# Patient Record
Sex: Male | Born: 1965 | Race: Black or African American | Hispanic: No | Marital: Married | Smoking: Never smoker
Health system: Southern US, Community
[De-identification: ages and names within clinical notes are randomized; demographics above are authoritative.]

## PROBLEM LIST (undated history)

## (undated) DIAGNOSIS — Y249XXA Unspecified firearm discharge, undetermined intent, initial encounter: Secondary | ICD-10-CM

## (undated) DIAGNOSIS — I10 Essential (primary) hypertension: Secondary | ICD-10-CM

## (undated) DIAGNOSIS — R7303 Prediabetes: Secondary | ICD-10-CM

## (undated) DIAGNOSIS — K219 Gastro-esophageal reflux disease without esophagitis: Secondary | ICD-10-CM

## (undated) DIAGNOSIS — G8929 Other chronic pain: Secondary | ICD-10-CM

## (undated) DIAGNOSIS — N189 Chronic kidney disease, unspecified: Secondary | ICD-10-CM

## (undated) DIAGNOSIS — W3400XA Accidental discharge from unspecified firearms or gun, initial encounter: Secondary | ICD-10-CM

## (undated) DIAGNOSIS — M549 Dorsalgia, unspecified: Secondary | ICD-10-CM

## (undated) HISTORY — DX: Other chronic pain: G89.29

## (undated) HISTORY — DX: Chronic kidney disease, unspecified: N18.9

## (undated) HISTORY — DX: Accidental discharge from unspecified firearms or gun, initial encounter: W34.00XA

## (undated) HISTORY — DX: Prediabetes: R73.03

## (undated) HISTORY — DX: Unspecified firearm discharge, undetermined intent, initial encounter: Y24.9XXA

## (undated) HISTORY — DX: Gastro-esophageal reflux disease without esophagitis: K21.9

## (undated) HISTORY — PX: BACK SURGERY: SHX140

## (undated) HISTORY — PX: LEG SURGERY: SHX1003

## (undated) HISTORY — DX: Dorsalgia, unspecified: M54.9

---

## 1999-05-30 ENCOUNTER — Encounter: Payer: Self-pay | Admitting: Neurosurgery

## 1999-05-31 ENCOUNTER — Encounter: Payer: Self-pay | Admitting: Neurosurgery

## 1999-05-31 ENCOUNTER — Ambulatory Visit (HOSPITAL_COMMUNITY): Admission: RE | Admit: 1999-05-31 | Discharge: 1999-06-01 | Payer: Self-pay | Admitting: Neurosurgery

## 2007-11-10 ENCOUNTER — Emergency Department (HOSPITAL_COMMUNITY): Admission: EM | Admit: 2007-11-10 | Discharge: 2007-11-10 | Payer: Self-pay | Admitting: Emergency Medicine

## 2011-08-14 ENCOUNTER — Emergency Department (HOSPITAL_COMMUNITY)
Admission: EM | Admit: 2011-08-14 | Discharge: 2011-08-14 | Disposition: A | Discharge status: Home / Self Care | Payer: No Typology Code available for payment source | Attending: Emergency Medicine | Admitting: Emergency Medicine

## 2011-08-14 ENCOUNTER — Emergency Department (HOSPITAL_COMMUNITY): Payer: No Typology Code available for payment source

## 2011-08-14 ENCOUNTER — Encounter (HOSPITAL_COMMUNITY): Payer: Self-pay | Admitting: *Deleted

## 2011-08-14 DIAGNOSIS — I10 Essential (primary) hypertension: Secondary | ICD-10-CM | POA: Insufficient documentation

## 2011-08-14 DIAGNOSIS — M549 Dorsalgia, unspecified: Secondary | ICD-10-CM

## 2011-08-14 DIAGNOSIS — M545 Low back pain, unspecified: Secondary | ICD-10-CM | POA: Insufficient documentation

## 2011-08-14 DIAGNOSIS — Z79899 Other long term (current) drug therapy: Secondary | ICD-10-CM | POA: Insufficient documentation

## 2011-08-14 HISTORY — DX: Essential (primary) hypertension: I10

## 2011-08-14 MED ORDER — METHOCARBAMOL 500 MG PO TABS
1000.0000 mg | ORAL_TABLET | Freq: Once | ORAL | Status: AC
  Administered 2011-08-14: 1000 mg via ORAL
  Filled 2011-08-14: qty 2

## 2011-08-14 MED ORDER — OXYCODONE-ACETAMINOPHEN 5-325 MG PO TABS: 1.0000 | ORAL_TABLET | ORAL | Status: AC | PRN

## 2011-08-14 MED ORDER — CYCLOBENZAPRINE HCL 10 MG PO TABS: 10.0000 mg | ORAL_TABLET | Freq: Three times a day (TID) | ORAL | Status: AC | PRN

## 2011-08-14 MED ORDER — IBUPROFEN 800 MG PO TABS
800.0000 mg | ORAL_TABLET | Freq: Once | ORAL | Status: AC
  Administered 2011-08-14: 800 mg via ORAL
  Filled 2011-08-14: qty 1
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
## 2011-08-20 NOTE — ED Provider Notes (Signed)
Medical screening examination/treatment/procedure(s) were performed by non-physician practitioner and as supervising physician I was immediately available for consultation/collaboration.  Nicoletta Dress. Colon Branch, MD 08/20/11 1020
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
## 2012-06-05 ENCOUNTER — Encounter: Payer: Self-pay | Admitting: Urgent Care

## 2012-06-05 ENCOUNTER — Ambulatory Visit (INDEPENDENT_AMBULATORY_CARE_PROVIDER_SITE_OTHER): Payer: Medicare Other | Admitting: Urgent Care

## 2012-06-05 ENCOUNTER — Other Ambulatory Visit: Payer: Self-pay | Admitting: Internal Medicine

## 2012-06-05 VITALS — BP 126/79 | HR 86 | Temp 97.4°F | Ht 71.0 in | Wt 234.4 lb

## 2012-06-05 DIAGNOSIS — K921 Melena: Secondary | ICD-10-CM

## 2012-06-05 MED ORDER — PEG 3350-KCL-NA BICARB-NACL 420 G PO SOLR: 4000.0000 mL | ORAL | Status: DC

## 2012-06-05 NOTE — Assessment & Plan Note (Signed)
Maxwell Howe is a pleasant 46 y.o. male with several episodes of recent hematochezia.  No response to hemorrhoid treatment.  Colonoscopy for further evaluation with Dr. Jena Gauss to determine source of bleeding.   Differentials include colorectal ca, polyp, or benign anorectal source.  I have discussed risks & benefits which include, but are not limited to, bleeding, infection, perforation & drug reaction.  The patient agrees with this plan & written consent will be obtained.

## 2012-06-05 NOTE — Progress Notes (Signed)
Faxed to PCP
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
## 2012-06-05 NOTE — Patient Instructions (Addendum)
Colonoscopy with Dr Jena Gauss Call if severe bleeding or go immediately to ER  Rectal Bleeding Rectal bleeding is when blood passes out of the anus. It is usually a sign that something is wrong. It may not be serious, but it should always be evaluated. Rectal bleeding may present as bright red blood or extremely dark stools. The color may range from dark red or maroon to black (like tar). It is important that the cause of rectal bleeding be identified so treatment can be started and the problem corrected. CAUSES   Hemorrhoids. These are enlarged (dilated) blood vessels or veins in the anal or rectal area.  Fistulas. Theseare abnormal, burrowing channels that usually run from inside the rectum to the skin around the anus. They can bleed.  Anal fissures. This is a tear in the tissue of the anus. Bleeding occurs with bowel movements.  Diverticulosis. This is a condition in which pockets or sacs project from the bowel wall. Occasionally, the sacs can bleed.  Diverticulitis. Thisis an infection involving diverticulosis of the colon.  Proctitis and colitis. These are conditions in which the rectum, colon, or both, can become inflamed and pitted (ulcerated).  Polyps and cancer. Polyps are non-cancerous (benign) growths in the colon that may bleed. Certain types of polyps turn into cancer.  Protrusion of the rectum. Part of the rectum can project from the anus and bleed.  Certain medicines.  Intestinal infections.  Blood vessel abnormalities. HOME CARE INSTRUCTIONS  Eat a high-fiber diet to keep your stool soft.  Limit activity.  Drink enough fluids to keep your urine clear or pale yellow.  Warm baths may be useful to soothe rectal pain.  Follow up with your caregiver as directed. SEEK IMMEDIATE MEDICAL CARE IF:  You develop increased bleeding.  You have black or dark red stools.  You vomit blood or material that looks like coffee grounds.  You have abdominal pain or  tenderness.  You have a fever.  You feel weak, nauseous, or you faint.  You have severe rectal pain or you are unable to have a bowel movement. MAKE SURE YOU:  Understand these instructions.  Will watch your condition.  Will get help right away if you are not doing well or get worse. Document Released: 01/05/2002 Document Revised: 10/08/2011 Document Reviewed: 12/31/2010 [facility] Patient Information 2013 [place], [place].

## 2012-06-17 ENCOUNTER — Encounter (HOSPITAL_COMMUNITY): Payer: Self-pay | Admitting: Pharmacy Technician

## 2012-06-24 ENCOUNTER — Encounter (HOSPITAL_COMMUNITY): Payer: Self-pay | Admitting: *Deleted

## 2012-06-24 ENCOUNTER — Ambulatory Visit (HOSPITAL_COMMUNITY)
Admission: RE | Admit: 2012-06-24 | Discharge: 2012-06-24 | Disposition: A | Discharge status: Home / Self Care | Payer: Medicare Other | Source: Ambulatory Visit | Attending: Internal Medicine | Admitting: Internal Medicine

## 2012-06-24 ENCOUNTER — Encounter (HOSPITAL_COMMUNITY)
Admission: RE | Disposition: A | Discharge status: Home / Self Care | Payer: Self-pay | Source: Ambulatory Visit | Attending: Internal Medicine

## 2012-06-24 DIAGNOSIS — I10 Essential (primary) hypertension: Secondary | ICD-10-CM | POA: Insufficient documentation

## 2012-06-24 DIAGNOSIS — K921 Melena: Secondary | ICD-10-CM

## 2012-06-24 HISTORY — PX: COLONOSCOPY: SHX5424

## 2012-06-24 SURGERY — COLONOSCOPY
Anesthesia: Moderate Sedation

## 2012-06-24 MED ORDER — MEPERIDINE HCL 100 MG/ML IJ SOLN
INTRAMUSCULAR | Status: AC
  Filled 2012-06-24: qty 2

## 2012-06-24 MED ORDER — MIDAZOLAM HCL 5 MG/5ML IJ SOLN
INTRAMUSCULAR | Status: DC | PRN
  Administered 2012-06-24 (×2): 2 mg via INTRAVENOUS

## 2012-06-24 MED ORDER — SODIUM CHLORIDE 0.45 % IV SOLN
INTRAVENOUS | Status: DC
  Administered 2012-06-24: 12:00:00 via INTRAVENOUS

## 2012-06-24 MED ORDER — STERILE WATER FOR IRRIGATION IR SOLN
Status: DC | PRN
  Administered 2012-06-24: 12:00:00

## 2012-06-24 MED ORDER — MIDAZOLAM HCL 5 MG/5ML IJ SOLN
INTRAMUSCULAR | Status: AC
  Filled 2012-06-24: qty 10

## 2012-06-24 MED ORDER — MEPERIDINE HCL 100 MG/ML IJ SOLN
INTRAMUSCULAR | Status: DC | PRN
  Administered 2012-06-24 (×2): 50 mg via INTRAVENOUS

## 2012-06-24 NOTE — Op Note (Signed)
[facility] [address] [place], [address]   COLONOSCOPY PROCEDURE REPORT  PATIENT: Maxwell Howe, Maxwell Howe  MR#:         [id number] BIRTHDATE: [birth year] , 46  yrs. old GENDER: Male ENDOSCOPIST: R.  Roetta Sessions, MD FACP FACG REFERRED BY:  Margorie John, M.D. PROCEDURE DATE:  06/24/2012 PROCEDURE:     Diagnostic colonoscopy  INDICATIONS: Paper hematochezia  INFORMED CONSENT:  The risks, benefits, alternatives and imponderables including but not limited to bleeding, perforation as well as the possibility of a missed lesion have been reviewed.  The potential for biopsy, lesion removal, etc. have also been discussed.  Questions have been answered.  All parties agreeable. Please see the history and physical in the medical record for more information.  MEDICATIONS: Versed  4 mg IV and Demerol 50 mg IV in divided dose.  DESCRIPTION OF PROCEDURE:  After a digital rectal exam was performed, the Pentax Colonoscope [phone]  colonoscope was advanced from the anus through the rectum and colon to the area of the cecum, ileocecal valve and appendiceal orifice.  The cecum was deeply intubated.  These structures were well-seen and photographed for the record.  From the level of the cecum and ileocecal valve, the scope was slowly and cautiously withdrawn.  The mucosal surfaces were carefully surveyed utilizing scope tip deflection to facilitate fold flattening as needed.  The scope was pulled down into the rectum where a thorough examination including retroflexion was performed.    FINDINGS:  Suboptimal to poor preparation. Normal rectum (friable anal canal); colonic mucosa appeared normal. A thin coating of tenacious stool particularly on the right side made exam more difficult.  THERAPEUTIC / DIAGNOSTIC MANEUVERS PERFORMED:  None  COMPLICATIONS: none  CECAL WITHDRAWAL TIME:  9 minutes  IMPRESSION:  Friable anal canal; otherwise normal-appearing rectum and colon.   I suspect benign anorectal bleeding.  RECOMMENDATIONS: Ten-day course of Anusol suppositories.  Daily fiber supplement Benefiber-2 tablespoons. CBC today. Patient is to contact us if bleeding does not subside.  Repeat colonoscopy in 5 years for screening purposes.   _______________________________ eSigned:  R. Roetta Sessions, MD FACP [facility] 06/24/2012 1:13 PM   CC:

## 2012-06-24 NOTE — Interval H&P Note (Signed)
History and Physical Interval Note:  06/24/2012 12:33 PM  Candace Cruise Hrivnak  has presented today for surgery, with the diagnosis of HEMATOCHEZIA  The various methods of treatment have been discussed with the patient and family. After consideration of risks, benefits and other options for treatment, the patient has consented to  Procedure(s) (LRB) with comments: COLONOSCOPY (N/A) - 12:30 as a surgical intervention .  The patient's history has been reviewed, patient examined, no change in status, stable for surgery.  I have reviewed the patient's chart and labs.  Questions were answered to the patient's satisfaction.     Eula Listen  As above. Colonoscopy per plan.The risks, benefits, limitations, alternatives and imponderables have been reviewed with the patient. Questions have been answered. All parties are agreeable.
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
## 2012-06-29 ENCOUNTER — Encounter (HOSPITAL_COMMUNITY): Payer: Self-pay | Admitting: Internal Medicine

## 2017-08-23 ENCOUNTER — Other Ambulatory Visit (HOSPITAL_BASED_OUTPATIENT_CLINIC_OR_DEPARTMENT_OTHER): Payer: Self-pay

## 2017-08-23 DIAGNOSIS — G4730 Sleep apnea, unspecified: Secondary | ICD-10-CM

## 2017-08-23 DIAGNOSIS — R5383 Other fatigue: Secondary | ICD-10-CM

## 2017-08-23 DIAGNOSIS — G4700 Insomnia, unspecified: Secondary | ICD-10-CM

## 2017-08-23 DIAGNOSIS — R0683 Snoring: Secondary | ICD-10-CM

## 2017-09-02 ENCOUNTER — Ambulatory Visit (INDEPENDENT_AMBULATORY_CARE_PROVIDER_SITE_OTHER): Payer: Medicare Other

## 2017-09-02 DIAGNOSIS — Z1211 Encounter for screening for malignant neoplasm of colon: Secondary | ICD-10-CM

## 2017-09-02 MED ORDER — PEG 3350-KCL-NA BICARB-NACL 420 G PO SOLR: 4000.0000 mL | ORAL | 0 refills | Status: DC

## 2017-09-02 NOTE — Patient Instructions (Signed)
Maxwell Howe   1965/12/08 MRN: [id number]    Procedure Date: 10/16/17 Time to register: 12:15 Place to register: [facility] Procedure Time: 1:15 Scheduled provider: [place] WITH TRI-LYTE SPLIT PREP  Please notify us immediately if you are diabetic, take iron supplements, or if you are on Coumadin or any other blood thinners.     You will need to purchase 1 fleet enema and 1 box of Bisacodyl '5mg'$  tablets.   2 DAYS BEFORE PROCEDURE:  DATE: 10/14/17   DAY: Monday Begin clear liquid diet AFTER your lunch meal. NO SOLID FOODS after this point.  1 DAY BEFORE PROCEDURE:  DATE: 10/15/17   DAY: Tuesday Continue clear liquids the entire day - NO SOLID FOOD.     At 2:00 pm:  Take 2 Bisacodyl tablets.   At 4:00pm:  Start drinking your solution. Make sure you mix well per instructions on the bottle. Try to drink 1 (one) 8 ounce glass every 10-15 minutes until you have consumed HALF the jug. You should complete by 6:00pm.You must keep the left over solution refrigerated until completed next day.  Continue clear liquids. You must drink plenty of clear liquids to prevent dehyration and kidney failure. Nothing to eat or drink after midnight.  EXCEPTION: If you take medications for your heart, blood pressure or breathing, you may take these medications with a small amount of clear liquid.    DAY OF PROCEDURE:   DATE: 10/16/17   DAY: Wednesday    Five hours before your procedure time @ 8:15am:  Finish remaining amout of bowel prep, drinking 1 (one) 8 ounce glass every 10-15 minutes until complete. You have two hours to consume remaining prep.   Three hours before your procedure time '@10'$ :15am:  Nothing by mouth.   At least one hour before going to the hospital:  Give yourself one Fleet enema. You may take your morning medications with sip of water unless we have instructed otherwise.      Please see below for Dietary Information.  CLEAR LIQUIDS  INCLUDE:  Water Jello (NOT red in color)   Ice Popsicles (NOT red in color)   Tea (sugar ok, no milk/cream) Powdered fruit flavored drinks  Coffee (sugar ok, no milk/cream) Gatorade/ Lemonade/ Kool-Aid  (NOT red in color)   Juice: apple, white grape, white cranberry Soft drinks  Clear bullion, consomme, broth (fat free beef/chicken/vegetable)  Carbonated beverages (any kind)  Strained chicken noodle soup Hard Candy   Remember: Clear liquids are liquids that will allow you to see your fingers on the other side of a clear glass. Be sure liquids are NOT red in color, and not cloudy, but CLEAR.  DO NOT EAT OR DRINK ANY OF THE FOLLOWING:  Dairy products of any kind   Cranberry juice Tomato juice / V8 juice   Grapefruit juice Orange juice     Red grape juice  Do not eat any solid foods, including such foods as: cereal, oatmeal, yogurt, fruits, vegetables, creamed soups, eggs, bread, crackers, pureed foods in a blender, etc.   HELPFUL HINTS FOR DRINKING PREP SOLUTION:   Make sure prep is extremely cold. Mix and refrigerate the the morning of the prep. You may also put in the freezer.   You may try mixing some Crystal Light or Country Time Lemonade if you prefer. Mix in small amounts; add more if necessary.  Try drinking through a straw  Rinse mouth with water or a mouthwash between glasses, to  remove after-taste.  Try sipping on a cold beverage /ice/ popsicles between glasses of prep.  Place a piece of sugar-free hard candy in mouth between glasses.  If you become nauseated, try consuming smaller amounts, or stretch out the time between glasses. Stop for 30-60 minutes, then slowly start back drinking.        OTHER INSTRUCTIONS  You will need a responsible adult at least 52 years of age to accompany you and drive you home. This person must remain in the waiting room during your procedure. The hospital will cancel your procedure if you do not have a responsible adult with you.    1. Wear loose fitting clothing that is easily removed. 2. Leave jewelry and other valuables at home.  3. Remove all body piercing jewelry and leave at home. 4. Total time from sign-in until discharge is approximately 2-3 hours. 5. You should go home directly after your procedure and rest. You can resume normal activities the day after your procedure. 6. The day of your procedure you should not:  Drive  Make legal decisions  Operate machinery  Drink alcohol  Return to work   You may call the office (Dept: [phone]) before 5:00pm, or page the doctor on call [phone]) after 5:00pm, for further instructions, if necessary.   Insurance Information YOU WILL NEED TO CHECK WITH YOUR INSURANCE COMPANY FOR THE BENEFITS OF COVERAGE YOU HAVE FOR THIS PROCEDURE.  UNFORTUNATELY, NOT ALL INSURANCE COMPANIES HAVE BENEFITS TO COVER ALL OR PART OF THESE TYPES OF PROCEDURES.  IT IS YOUR RESPONSIBILITY TO CHECK YOUR BENEFITS, HOWEVER, WE WILL BE GLAD TO ASSIST YOU WITH ANY CODES YOUR INSURANCE COMPANY MAY NEED.    PLEASE NOTE THAT MOST INSURANCE COMPANIES WILL NOT COVER A SCREENING COLONOSCOPY FOR PEOPLE UNDER THE AGE OF 50  IF YOU HAVE BCBS INSURANCE, YOU MAY HAVE BENEFITS FOR A SCREENING COLONOSCOPY BUT IF POLYPS ARE FOUND THE DIAGNOSIS WILL CHANGE AND THEN YOU MAY HAVE A DEDUCTIBLE THAT WILL NEED TO BE MET. SO PLEASE MAKE SURE YOU CHECK YOUR BENEFITS FOR A SCREENING COLONOSCOPY AS WELL AS A DIAGNOSTIC COLONOSCOPY.

## 2017-09-02 NOTE — Progress Notes (Signed)
Gastroenterology Pre-Procedure Review  Request Date:09/02/17 Requesting Physician: 5 year recall  PATIENT REVIEW QUESTIONS: The patient responded to the following health history questions as indicated:    1. Diabetes Melitis: no 2. Joint replacements in the past 12 months: no 3. Major health problems in the past 3 months: no 4. Has an artificial valve or MVP: no 5. Has a defibrillator: no 6. Has been advised in past to take antibiotics in advance of a procedure like teeth cleaning: no 7. Family history of colon cancer: no  8. Alcohol Use: yes (socially) 9. History of sleep apnea: no  10. History of coronary artery or other vascular stents placed within the last 12 months: no 11. History of any prior anesthesia complications: no    MEDICATIONS & ALLERGIES:    Patient reports the following regarding taking any blood thinners:   Plavix? no Aspirin? no Coumadin? no Brilinta? no Xarelto? no Eliquis? no Pradaxa? no Savaysa? no Effient? no  Patient confirms/reports the following medications:  Current Outpatient Medications  Medication Sig Dispense Refill  . cyclobenzaprine (FLEXERIL) 10 MG tablet Take 10 mg by mouth 3 (three) times daily as needed. Muscle Spasms    . lisinopril-hydrochlorothiazide (PRINZIDE,ZESTORETIC) 10-12.5 MG per tablet Take 1 tablet by mouth daily.    . Multiple Vitamin (MULTIVITAMIN WITH MINERALS) TABS Take 1 tablet by mouth daily.    Marland Kitchen. omeprazole (PRILOSEC) 20 MG capsule Take 20 mg by mouth daily.     No current facility-administered medications for this visit.     Patient confirms/reports the following allergies:  No Known Allergies  No orders of the defined types were placed in this encounter.   AUTHORIZATION INFORMATION Primary Insurance: Medicare,  ID #: [id number] Pre-Cert / Auth required: no   SCHEDULE INFORMATION: Procedure has been scheduled as follows:  Date: 10/16/17, Time: 1:15 Location: [facility] Dr[facility]  This Gastroenterology  Pre-Precedure Review Form is being routed to the following provider(s): Tana CoastLeslie Lewis PA-C

## 2017-09-03 NOTE — Progress Notes (Signed)
Ok to schedule.

## 2017-09-04 ENCOUNTER — Ambulatory Visit: Payer: Medicare Other | Attending: Pulmonary Disease | Admitting: Neurology

## 2017-09-04 DIAGNOSIS — G4730 Sleep apnea, unspecified: Secondary | ICD-10-CM

## 2017-09-04 DIAGNOSIS — G4733 Obstructive sleep apnea (adult) (pediatric): Secondary | ICD-10-CM | POA: Diagnosis not present

## 2017-09-04 DIAGNOSIS — R5383 Other fatigue: Secondary | ICD-10-CM

## 2017-09-04 DIAGNOSIS — G4700 Insomnia, unspecified: Secondary | ICD-10-CM

## 2017-09-04 DIAGNOSIS — R0683 Snoring: Secondary | ICD-10-CM

## 2017-09-08 NOTE — Procedures (Signed)
[place] A. Merlene Laughter, MD     [email or web address]             NOCTURNAL POLYSOMNOGRAPHY   LOCATION: [facility]   Patient Name: Maxwell Howe, Maxwell Howe Date: 09/04/2017 Gender: Male D.O.B: 08-Jul-1966 Age (years): 51 Referring Provider: Sinda Du Height (inches): 70 Interpreting Physician: Phillips Odor MD, ABSM Weight (lbs): 244 RPSGT: Peak, Robert BMI: 35 MRN: [id number] Neck Size: 18.00 CLINICAL INFORMATION Sleep Study Type: Split Night CPAP  Indication for sleep study: Fatigue, Snoring  Epworth Sleepiness Score: 5  SLEEP STUDY TECHNIQUE As per the AASM Manual for the Scoring of Sleep and Associated Events v2.3 (April 2016) with a hypopnea requiring 4% desaturations.  The channels recorded and monitored were frontal, central and occipital EEG, electrooculogram (EOG), submentalis EMG (chin), nasal and oral airflow, thoracic and abdominal wall motion, anterior tibialis EMG, snore microphone, electrocardiogram, and pulse oximetry. Continuous positive airway pressure (CPAP) was initiated when the patient met split night criteria and was titrated according to treat sleep-disordered breathing.  MEDICATIONS Medications self-administered by patient taken the night of the study : N/A  Current Outpatient Medications:  .  cyclobenzaprine (FLEXERIL) 10 MG tablet, Take 10 mg by mouth 3 (three) times daily as needed. Muscle Spasms, Disp: , Rfl:  .  lisinopril-hydrochlorothiazide (PRINZIDE,ZESTORETIC) 10-12.5 MG per tablet, Take 1 tablet by mouth daily., Disp: , Rfl:  .  Multiple Vitamin (MULTIVITAMIN WITH MINERALS) TABS, Take 1 tablet by mouth daily., Disp: , Rfl:  .  omeprazole (PRILOSEC) 20 MG capsule, Take 20 mg by mouth daily., Disp: , Rfl:  .  polyethylene glycol-electrolytes (TRILYTE) 420 g solution, Take 4,000 mLs by mouth as directed., Disp: 4000 mL, Rfl: 0  RESPIRATORY PARAMETERS Diagnostic  Total AHI (/hr): 31.4 RDI (/hr): 35.4 OA Index  (/hr): 5.1 CA Index (/hr): 1.8 REM AHI (/hr): 48.0 NREM AHI (/hr): 24.3 Supine AHI (/hr): 43.6 Non-supine AHI (/hr): 22.83 Min O2 Sat (%): 75.00 Mean O2 (%): 91.45 Time below 88% (min): 17.4   Titration  Optimal Pressure (cm): 6 AHI at Optimal Pressure (/hr): 5.5 Min O2 at Optimal Pressure (%): 87.0 Supine % at Optimal (%): 0 Sleep % at Optimal (%): 93   SLEEP ARCHITECTURE The recording time for the entire night was 430.5 minutes.  During a baseline period of 212.5 minutes, the patient slept for 166.0 minutes in REM and nonREM, yielding a sleep efficiency of 78.1%. Sleep onset after lights out was 30.4 minutes with a REM latency of 64.0 minutes. The patient spent 12.05% of the night in stage N1 sleep, 57.83% in stage N2 sleep, 0.00% in stage N3 and 30.12% in REM.  During the titration period of 217.5 minutes, the patient slept for 193.0 minutes in REM and nonREM, yielding a sleep efficiency of 88.8%. Sleep onset after CPAP initiation was 6.9 minutes with a REM latency of 77.0 minutes. The patient spent 12.44% of the night in stage N1 sleep, 55.70% in stage N2 sleep, 3.63% in stage N3 and 28.24% in REM.  CARDIAC DATA The 2 lead EKG demonstrated sinus rhythm. The mean heart rate was 64.04 beats per minute. Other EKG findings include: None. LEG MOVEMENT DATA The total Periodic Limb Movements of Sleep (PLMS) were 0. The PLMS index was 0.00.  IMPRESSIONS Severe obstructive sleep apnea occurred during the diagnostic portion of the study (AHI = 31.4/hour). The optimal CPAP is ( 6 cm of water).  Delano Metz, MD Diplomate, American Board of Sleep Medicine.  ELECTRONICALLY SIGNED ON:  09/08/2017, 10:11  PM [facility] PH: [phone]   FX: [phone] ACCREDITED BY THE AMERICAN ACADEMY OF SLEEP MEDICINE

## 2017-10-16 ENCOUNTER — Encounter (HOSPITAL_COMMUNITY)
Admission: RE | Disposition: A | Discharge status: Home / Self Care | Payer: Self-pay | Source: Ambulatory Visit | Attending: Internal Medicine

## 2017-10-16 ENCOUNTER — Encounter (HOSPITAL_COMMUNITY): Payer: Self-pay

## 2017-10-16 ENCOUNTER — Encounter (HOSPITAL_COMMUNITY): Payer: Self-pay | Admitting: Anesthesiology

## 2017-10-16 ENCOUNTER — Ambulatory Visit (HOSPITAL_COMMUNITY)
Admission: RE | Admit: 2017-10-16 | Discharge: 2017-10-16 | Disposition: A | Discharge status: Home / Self Care | Payer: Medicare Other | Source: Ambulatory Visit | Attending: Internal Medicine | Admitting: Internal Medicine

## 2017-10-16 ENCOUNTER — Other Ambulatory Visit: Payer: Self-pay

## 2017-10-16 DIAGNOSIS — Z800 Family history of malignant neoplasm of digestive organs: Secondary | ICD-10-CM | POA: Diagnosis not present

## 2017-10-16 DIAGNOSIS — Z79899 Other long term (current) drug therapy: Secondary | ICD-10-CM | POA: Insufficient documentation

## 2017-10-16 DIAGNOSIS — Z1211 Encounter for screening for malignant neoplasm of colon: Secondary | ICD-10-CM | POA: Insufficient documentation

## 2017-10-16 DIAGNOSIS — I10 Essential (primary) hypertension: Secondary | ICD-10-CM | POA: Diagnosis not present

## 2017-10-16 HISTORY — PX: COLONOSCOPY: SHX5424

## 2017-10-16 SURGERY — COLONOSCOPY
Anesthesia: Moderate Sedation

## 2017-10-16 MED ORDER — MIDAZOLAM HCL 5 MG/5ML IJ SOLN
INTRAMUSCULAR | Status: AC
  Filled 2017-10-16: qty 10

## 2017-10-16 MED ORDER — SODIUM CHLORIDE 0.9 % IV SOLN
INTRAVENOUS | Status: DC
  Administered 2017-10-16: 11:00:00 via INTRAVENOUS

## 2017-10-16 MED ORDER — MIDAZOLAM HCL 5 MG/5ML IJ SOLN
INTRAMUSCULAR | Status: DC | PRN
  Administered 2017-10-16 (×2): 1 mg via INTRAVENOUS
  Administered 2017-10-16: 2 mg via INTRAVENOUS

## 2017-10-16 MED ORDER — STERILE WATER FOR IRRIGATION IR SOLN
Status: DC | PRN
  Administered 2017-10-16: 2.5 mL

## 2017-10-16 MED ORDER — MEPERIDINE HCL 100 MG/ML IJ SOLN
INTRAMUSCULAR | Status: AC
  Filled 2017-10-16: qty 2

## 2017-10-16 MED ORDER — MEPERIDINE HCL 100 MG/ML IJ SOLN
INTRAMUSCULAR | Status: DC | PRN
  Administered 2017-10-16: 50 mg via INTRAVENOUS
  Administered 2017-10-16: 25 mg via INTRAVENOUS

## 2017-10-16 MED ORDER — ONDANSETRON HCL 4 MG/2ML IJ SOLN
INTRAMUSCULAR | Status: DC | PRN
  Administered 2017-10-16: 4 mg via INTRAVENOUS

## 2017-10-16 MED ORDER — ONDANSETRON HCL 4 MG/2ML IJ SOLN
INTRAMUSCULAR | Status: DC
Start: 2017-10-16 — End: 2017-10-16
  Filled 2017-10-16: qty 2

## 2017-10-16 NOTE — Op Note (Addendum)
[facility] Patient Name: Maxwell Howe Procedure Date: 10/16/2017 11:29 AM MRN: [id number] Date of Birth: [birth year] Attending MD: Gennette Pac , MD CSN: [id number] Age: 52 Admit Type: Outpatient Procedure:                Colonoscopy Indications:              Screening in patient at increased risk: Family                            history of 1st-degree relative with colorectal                            cancer before age 52 years Providers:                Gennette Pac, MD, Nena Polio, RN, Burke Keels, Technician Referring MD:             Smith Annitta Fifield Medicines:                Midazolam 4 mg IV, Meperidine 75 mg IV Complications:            No immediate complications. Estimated Blood Loss:     Estimated blood loss was minimal. Procedure:                Pre-Anesthesia Assessment:                           - Prior to the procedure, a History and Physical                            was performed, and patient medications and                            allergies were reviewed. The patient's tolerance of                            previous anesthesia was also reviewed. The risks                            and benefits of the procedure and the sedation                            options and risks were discussed with the patient.                            All questions were answered, and informed consent                            was obtained. Prior Anticoagulants: The patient has                            taken no previous anticoagulant or antiplatelet  agents. ASA Grade Assessment: II - A patient with                            mild systemic disease. After reviewing the risks                            and benefits, the patient was deemed in                            satisfactory condition to undergo the procedure.                           After obtaining informed consent, the colonoscope             was passed under direct vision. Throughout the                            procedure, the patient's blood pressure, pulse, and                            oxygen saturations were monitored continuously. The                            EC-[id number] ([id number] scope was introduced through                            the anus and advanced to the the cecum, identified                            by appendiceal orifice and ileocecal valve. The                            colonoscopy was performed without difficulty. The                            patient tolerated the procedure well. The quality                            of the bowel preparation was adequate. The entire                            colon was well visualized. The ileocecal valve,                            appendiceal orifice, and rectum were photographed. Scope In: 11:42:25 AM Scope Out: 11:54:51 AM Scope Withdrawal Time: 0 hours 8 minutes 40 seconds  Total Procedure Duration: 0 hours 12 minutes 26 seconds  Findings:      The perianal and digital rectal examinations were normal.      The exam was without abnormality on direct and retroflexion views. Impression:               - The examination was otherwise normal on direct  and retroflexion views.                           - No specimens collected. Moderate Sedation:      Moderate (conscious) sedation was administered by the endoscopy nurse       and supervised by the endoscopist. The following parameters were       monitored: oxygen saturation, heart rate, blood pressure, respiratory       rate, EKG, adequacy of pulmonary ventilation, and response to care.       Total physician intraservice time was 18 minutes. Recommendation:           - Patient has a contact number available for                            emergencies. The signs and symptoms of potential                            delayed complications were discussed with the                             patient. Return to normal activities tomorrow.                            Written discharge instructions were provided to the                            patient.                           - Resume previous diet.                           - Continue present medications.                           - Repeat colonoscopy in 5 years for screening                            purposes.                           - Return to GI clinic PRN. Procedure Code(s):        --- Professional ---                           [phone], Colonoscopy, flexible; diagnostic, including                            collection of specimen(s) by brushing or washing,                            when performed (separate procedure)                           99152, Moderate sedation services provided by the  same physician or other qualified health care                            professional performing the diagnostic or                            therapeutic service that the sedation supports,                            requiring the presence of an independent trained                            observer to assist in the monitoring of the                            patient's level of consciousness and physiological                            status; initial 15 minutes of intraservice time,                            patient age 93 years or older Diagnosis Code(s):        --- Professional ---                           Z80.0, Family history of malignant neoplasm of                            digestive organs CPT copyright 2016 American Medical Association. All rights reserved. The codes documented in this report are preliminary and upon coder review may  be revised to meet current compliance requirements. Gerrit Friends. Evi Mccomb, MD Gennette Pac, MD 10/16/2017 12:00:43 PM This report has been signed electronically. Number of Addenda: 0

## 2017-10-16 NOTE — Discharge Instructions (Addendum)
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
## 2017-10-21 ENCOUNTER — Encounter (HOSPITAL_COMMUNITY): Payer: Self-pay | Admitting: Internal Medicine

## 2017-10-30 ENCOUNTER — Encounter: Payer: Self-pay | Admitting: Internal Medicine

## 2019-11-16 ENCOUNTER — Encounter: Payer: Self-pay | Admitting: Internal Medicine

## 2019-11-17 ENCOUNTER — Encounter: Payer: Self-pay | Admitting: Emergency Medicine

## 2019-11-18 ENCOUNTER — Encounter: Payer: Self-pay | Admitting: Internal Medicine

## 2019-12-05 ENCOUNTER — Encounter: Payer: Self-pay | Admitting: Gastroenterology
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
## 2019-12-07 ENCOUNTER — Other Ambulatory Visit: Payer: Self-pay

## 2019-12-07 ENCOUNTER — Encounter: Payer: Self-pay | Admitting: Gastroenterology

## 2019-12-07 ENCOUNTER — Telehealth (INDEPENDENT_AMBULATORY_CARE_PROVIDER_SITE_OTHER): Payer: Medicare Other | Admitting: Gastroenterology

## 2019-12-07 ENCOUNTER — Telehealth: Payer: Self-pay | Admitting: *Deleted

## 2019-12-07 DIAGNOSIS — D649 Anemia, unspecified: Secondary | ICD-10-CM | POA: Diagnosis not present

## 2019-12-07 NOTE — Telephone Encounter (Signed)
Maxwell Howe, you are scheduled for a virtual visit with your provider today.  Just as we do with appointments in the office, we must obtain your consent to participate.  Your consent will be active for this visit and any virtual visit you may have with one of our providers in the next 365 days.  If you have a MyChart account, I can also send a copy of this consent to you electronically.  All virtual visits are billed to your insurance company just like a traditional visit in the office.  As this is a virtual visit, video technology does not allow for your provider to perform a traditional examination.  This may limit your provider's ability to fully assess your condition.  If your provider identifies any concerns that need to be evaluated in person or the need to arrange testing such as labs, EKG, etc, we will make arrangements to do so.  Although advances in technology are sophisticated, we cannot ensure that it will always work on either your end or our end.  If the connection with a video visit is poor, we may have to switch to a telephone visit.  With either a video or telephone visit, we are not always able to ensure that we have a secure connection.   I need to obtain your verbal consent now.   Are you willing to proceed with your visit today?

## 2019-12-07 NOTE — Patient Instructions (Signed)
We will request labs from PCP to review further.  Once I have reviewed these, I will have further recommendations to follow.  We may need additional lab work or you may need procedures including colonoscopy or possible upper endoscopy depending upon lab work.  If you do not hear from our office within the next couple of weeks, please call us back.  Ermalinda Memos, PA-C [facility]

## 2019-12-07 NOTE — Telephone Encounter (Signed)
Pt consented to a virtual visit today.   

## 2019-12-27 ENCOUNTER — Telehealth: Payer: Self-pay | Admitting: Gastroenterology

## 2019-12-27 NOTE — Telephone Encounter (Signed)
Received and reviewed labs including CBC, CMP, UA, and lipid panel from dates ranging from November 2016 to April 2021 from PCP.   Relevant labs below. Will have complete lab reports scanned into chart.   06/09/2015: Hemoglobin 12.5 (L), MCV 89.7, MCH 28.6, MCHC 32.0 11/11/2019: Hemoglobin 12.1 (L), MCV 86.5, MCH 29.1, MCHC 33.6  Creatinine: 1.29 (06/09/2015), 1.16 (02/03/2018), 1.20 (11/27/2018), 1.24 (02/17/2019), 1.16 (11/11/2019).   Chronic stable normocytic anemia. Nothing to suggest iron deficiency at this point or need for early interval colonoscopy. Would recommend Iron panel to evaluate for IDA.   Helmut Muster, please let patient know I have received his labs from PCP. Hemoglobin is just slightly low. Appears this has been present at least 2016 and is stable. Recommend we obtain an iron panel to evaluate this further. If evidence of iron deficiency, then would consider early interval procedures, otherwise, he would need to follow-up with PCP for anemia. Please arrange iron panel.

## 2019-12-29 ENCOUNTER — Other Ambulatory Visit: Payer: Self-pay | Admitting: *Deleted

## 2019-12-29 DIAGNOSIS — D649 Anemia, unspecified: Secondary | ICD-10-CM

## 2019-12-29 NOTE — Telephone Encounter (Signed)
Called pt and informed him that his hemoglobin is just slightly low per [facility].  Pt made aware that [facility] suggests arranging an iron panel.  Pt requested [place] to send lab order to [facility] so he can have it drawn there (closer to his home).  Pt made aware that once we receive results that we will be back in contact with future recommendations.  Pt voiced understanding.  Lab ordered placed in Epic and faxed to [facility].  (f: [phone])

## 2020-01-06 ENCOUNTER — Telehealth: Payer: Self-pay | Admitting: Internal Medicine

## 2020-01-06 LAB — IRON,TIBC AND FERRITIN PANEL
%SAT: 29 % (calc) (ref 20–48)
Ferritin: 143 ng/mL (ref 38–380)
Iron: 79 ug/dL (ref 50–180)
TIBC: 276 mcg/dL (calc) (ref 250–425)

## 2020-01-06 NOTE — Progress Notes (Signed)
Iron panel within normal limits. As his hemoglobin has been stable since 2016 without any evidence of iron deficiency or alarm symptoms (brbpr, melena, or weight loss), no need to repeat early interval colonoscopy at this time as his last TCS was in 2019 and completely normal. Recommend he follow-up with PCP for further evaluation of anemia. Should he develop brbpr, melena, or unintentional weight loss, hemoglobin begin to trend down, or other GI concerns he should let us know. We will plan to see him back as needed. He is due for TCS in 2023 and is on recall.

## 2020-01-06 NOTE — Telephone Encounter (Signed)
PATIENT SAID SOMEONE FROM HERE CALLED HIM, PLEASE CALL BACK

## 2020-01-06 NOTE — Telephone Encounter (Signed)
Lmom, waiting on a return call.  

## 2020-01-06 NOTE — Telephone Encounter (Signed)
Pt was given results. See result note.  °

## 2022-09-07 ENCOUNTER — Encounter: Payer: Self-pay | Admitting: Internal Medicine

## 2022-09-13 ENCOUNTER — Encounter: Payer: Self-pay | Admitting: *Deleted
